# Patient Record
Sex: Female | Born: 1940 | Race: White | Hispanic: No | Smoking: Former smoker
Health system: Southern US, Community
[De-identification: ages and names within clinical notes are randomized; demographics above are authoritative.]

## PROBLEM LIST (undated history)

## (undated) DIAGNOSIS — K5792 Diverticulitis of intestine, part unspecified, without perforation or abscess without bleeding: Secondary | ICD-10-CM

## (undated) DIAGNOSIS — I10 Essential (primary) hypertension: Secondary | ICD-10-CM

## (undated) DIAGNOSIS — E119 Type 2 diabetes mellitus without complications: Secondary | ICD-10-CM

## (undated) DIAGNOSIS — R011 Cardiac murmur, unspecified: Secondary | ICD-10-CM

## (undated) DIAGNOSIS — K5900 Constipation, unspecified: Secondary | ICD-10-CM

## (undated) DIAGNOSIS — R079 Chest pain, unspecified: Secondary | ICD-10-CM

---

## 2022-04-04 ENCOUNTER — Other Ambulatory Visit: Payer: Self-pay

## 2022-04-04 ENCOUNTER — Encounter (HOSPITAL_BASED_OUTPATIENT_CLINIC_OR_DEPARTMENT_OTHER): Payer: Self-pay

## 2022-04-04 ENCOUNTER — Emergency Department (HOSPITAL_BASED_OUTPATIENT_CLINIC_OR_DEPARTMENT_OTHER)
Admission: EM | Admit: 2022-04-04 | Discharge: 2022-04-04 | Disposition: A | Discharge status: Home / Self Care | Payer: Medicare Other | Attending: Emergency Medicine | Admitting: Emergency Medicine

## 2022-04-04 DIAGNOSIS — R42 Dizziness and giddiness: Secondary | ICD-10-CM | POA: Diagnosis present

## 2022-04-04 HISTORY — DX: Diverticulitis of intestine, part unspecified, without perforation or abscess without bleeding: K57.92

## 2022-04-04 HISTORY — DX: Essential (primary) hypertension: I10

## 2022-04-04 HISTORY — DX: Constipation, unspecified: K59.00

## 2022-04-04 HISTORY — DX: Cardiac murmur, unspecified: R01.1

## 2022-04-04 HISTORY — DX: Chest pain, unspecified: R07.9

## 2022-04-04 HISTORY — DX: Type 2 diabetes mellitus without complications: E11.9

## 2022-04-04 LAB — CBC WITH DIFFERENTIAL/PLATELET
Abs Immature Granulocytes: 0.03 10*3/uL (ref 0.00–0.07)
Basophils Absolute: 0 10*3/uL (ref 0.0–0.1)
Basophils Relative: 1 %
Eosinophils Absolute: 0.1 10*3/uL (ref 0.0–0.5)
Eosinophils Relative: 1 %
HCT: 35.6 % — ABNORMAL LOW (ref 36.0–46.0)
Hemoglobin: 11.7 g/dL — ABNORMAL LOW (ref 12.0–15.0)
Immature Granulocytes: 1 %
Lymphocytes Relative: 11 %
Lymphs Abs: 0.6 10*3/uL — ABNORMAL LOW (ref 0.7–4.0)
MCH: 31.9 pg (ref 26.0–34.0)
MCHC: 32.9 g/dL (ref 30.0–36.0)
MCV: 97 fL (ref 80.0–100.0)
Monocytes Absolute: 0.5 10*3/uL (ref 0.1–1.0)
Monocytes Relative: 9 %
Neutro Abs: 4.4 10*3/uL (ref 1.7–7.7)
Neutrophils Relative %: 77 %
Platelets: 216 10*3/uL (ref 150–400)
RBC: 3.67 MIL/uL — ABNORMAL LOW (ref 3.87–5.11)
RDW: 12.8 % (ref 11.5–15.5)
WBC: 5.7 10*3/uL (ref 4.0–10.5)
nRBC: 0 % (ref 0.0–0.2)

## 2022-04-04 LAB — COMPREHENSIVE METABOLIC PANEL
ALT: 23 U/L (ref 0–44)
AST: 24 U/L (ref 15–41)
Albumin: 3.6 g/dL (ref 3.5–5.0)
Alkaline Phosphatase: 60 U/L (ref 38–126)
Anion gap: 6 (ref 5–15)
BUN: 39 mg/dL — ABNORMAL HIGH (ref 8–23)
CO2: 26 mmol/L (ref 22–32)
Calcium: 10.1 mg/dL (ref 8.9–10.3)
Chloride: 108 mmol/L (ref 98–111)
Creatinine, Ser: 1.06 mg/dL — ABNORMAL HIGH (ref 0.44–1.00)
GFR, Estimated: 52 mL/min — ABNORMAL LOW (ref >60)
Glucose, Bld: 152 mg/dL — ABNORMAL HIGH (ref 70–99)
Potassium: 3.7 mmol/L (ref 3.5–5.1)
Sodium: 140 mmol/L (ref 135–145)
Total Bilirubin: <0.1 mg/dL — ABNORMAL LOW (ref 0.3–1.2)
Total Protein: 6.1 g/dL — ABNORMAL LOW (ref 6.5–8.1)

## 2022-04-04 LAB — TROPONIN I (HIGH SENSITIVITY): Troponin I (High Sensitivity): 13 ng/L (ref <18)

## 2022-04-04 MED ORDER — MECLIZINE HCL 25 MG PO TABS
25.0000 mg | ORAL_TABLET | Freq: Once | ORAL | Status: AC
  Administered 2022-04-04: 25 mg via ORAL
  Filled 2022-04-04: qty 1

## 2022-04-04 MED ORDER — SODIUM CHLORIDE 0.9 % IV BOLUS
1000.0000 mL | Freq: Once | INTRAVENOUS | Status: AC
  Administered 2022-04-04: 1000 mL via INTRAVENOUS

## 2022-04-04 NOTE — Discharge Instructions (Signed)
You appear dehydrated on your blood work.  Please try and increase your fluid intake.  Follow-up with your family doctor in the office. ?

## 2022-04-04 NOTE — ED Notes (Signed)
Patient discharged to home.  All discharge instructions reviewed.  Patient verbalized understanding via teachback method.  VS WDL.  Respirations even and unlabored.  Ambulatory out of ED.   °

## 2022-04-04 NOTE — ED Triage Notes (Signed)
Pt reports dizziness since her last hospital visit 03/19/22. Feels weak , difficulty ambulating and cant sleep at night. Bowels are not moving good. Last BM yesterday but small ?

## 2022-04-04 NOTE — ED Provider Notes (Signed)
?[facility] EMERGENCY DEPARTMENT ?Provider Note ? ? ?CSN: [id number] ?Arrival date & time: 04/04/22  1842 ? ?  ? ?History ? ?Chief Complaint  ?Patient presents with  ? Dizziness  ? ? ?April Cuevas is a 81 y.o. female. ? ?81 yo F with a chief complaint of dizziness.  This has been an ongoing issue for her.  She tells me that its been getting progressively worse over the past 3 weeks or so.  Sometimes better and sometimes it is worse.  This morning she was feeling fine and she was up and moving around and went out shopping.  She later this afternoon went to get up and felt like her legs were too weak to hold her and that she might fall down.  She denies any actual injury.  She is also been complaining that her ear has been bothering her off and on for a while.  Seems to come and go.  Been bothering her for the last 3 days.  No cough congestion or fevers.  She was on an antibiotic when she was in the hospital for diverticulitis.  Otherwise denies any new medications. ? ? ?Dizziness ? ?  ? ?Home Medications ?Prior to Admission medications   ?Not on File  ?   ? ?Allergies    ?Penicillins   ? ?Review of Systems   ?Review of Systems  ?Neurological:  Positive for dizziness.  ? ?Physical Exam ?Updated Vital Signs ?BP 135/62 (BP Location: Right Arm)   Pulse 77   Temp 98.4 ?F (36.9 ?C)   Resp 17   Ht 5\' 3"  (1.6 m)   Wt 65.8 kg   SpO2 94%   BMI 25.69 kg/m?  ?Physical Exam ? ?ED Results / Procedures / Treatments   ?Labs ?(all labs ordered are listed, but only abnormal results are displayed) ?Labs Reviewed  ?CBC WITH DIFFERENTIAL/PLATELET - Abnormal; Notable for the following components:  ?    Result Value  ? RBC 3.67 (*)   ? Hemoglobin 11.7 (*)   ? HCT 35.6 (*)   ? Lymphs Abs 0.6 (*)   ? All other components within normal limits  ?COMPREHENSIVE METABOLIC PANEL - Abnormal; Notable for the following components:  ? Glucose, Bld 152 (*)   ? BUN 39 (*)   ? Creatinine, Ser 1.06 (*)   ? Total Protein 6.1 (*)   ? Total  Bilirubin <0.1 (*)   ? GFR, Estimated 52 (*)   ? All other components within normal limits  ?TROPONIN I (HIGH SENSITIVITY)  ? ? ?EKG ?EKG Interpretation ? ?Date/Time:  Monday April 04 2022 19:10:39 EDT ?Ventricular Rate:  70 ?PR Interval:  252 ?QRS Duration: 97 ?QT Interval:  400 ?QTC Calculation: 432 ?R Axis:   7 ?Text Interpretation: Sinus rhythm Prolonged PR interval LVH with secondary repolarization abnormality No old tracing to compare Confirmed by 12-11-1989 [phone]) on 04/04/2022 8:07:31 PM ? ?Radiology ?No results found. ? ?Procedures ?Procedures  ? ? ?Medications Ordered in ED ?Medications  ?sodium chloride 0.9 % bolus 1,000 mL (0 mLs Intravenous Stopped 04/04/22 2050)  ?meclizine (ANTIVERT) tablet 25 mg (25 mg Oral Given 04/04/22 1935)  ? ? ?ED Course/ Medical Decision Making/ A&P ?  ?                        ?Medical Decision Making ?Amount and/or Complexity of Data Reviewed ?Labs: ordered. ? ? ?81 yo F with a chief complaints of fatigue.  This seems  to happen off and on and seems to occur not infrequently and not consistently.  The patient without any obvious stroke symptoms.  Complaining mostly of fatigue when she stands up.  We will give a bolus of IV fluids.  Check blood work.  Reassess. ? ?Patient's blood work is resulted and showed her BUN is up a bit which I suspect is likely due to acute dehydration.  Patient is feeling a bit better after IV fluids and like to go home.  PCP follow-up. ? ?9:17 PM:  I have discussed the diagnosis/risks/treatment options with the patient and family.  Evaluation and diagnostic testing in the emergency department does not suggest an emergent condition requiring admission or immediate intervention beyond what has been performed at this time.  They will follow up with  PCP. We also discussed returning to the ED immediately if new or worsening sx occur. We discussed the sx which are most concerning (e.g., sudden worsening pain, fever, inability to tolerate by mouth) that  necessitate immediate return. Medications administered to the patient during their visit and any new prescriptions provided to the patient are listed below. ? ?Medications given during this visit ?Medications  ?sodium chloride 0.9 % bolus 1,000 mL (0 mLs Intravenous Stopped 04/04/22 2050)  ?meclizine (ANTIVERT) tablet 25 mg (25 mg Oral Given 04/04/22 1935)  ? ? ? ?The patient appears reasonably screen and/or stabilized for discharge and I doubt any other medical condition or other [facility] requiring further screening, evaluation, or treatment in the ED at this time prior to discharge.  ? ? ? ? ? ? ? ? ?Final Clinical Impression(s) / ED Diagnoses ?Final diagnoses:  ?Dizziness  ? ? ?Rx / DC Orders ?ED Discharge Orders   ? ? None  ? ?  ? ? ?  ?Melene Plan, DO ?04/04/22 2117 ? ?

## 2022-04-29 ENCOUNTER — Encounter (HOSPITAL_BASED_OUTPATIENT_CLINIC_OR_DEPARTMENT_OTHER): Payer: Self-pay | Admitting: Emergency Medicine

## 2022-04-29 ENCOUNTER — Emergency Department (HOSPITAL_BASED_OUTPATIENT_CLINIC_OR_DEPARTMENT_OTHER): Payer: Medicare Other

## 2022-04-29 ENCOUNTER — Emergency Department (HOSPITAL_BASED_OUTPATIENT_CLINIC_OR_DEPARTMENT_OTHER)
Admission: EM | Admit: 2022-04-29 | Discharge: 2022-04-29 | Disposition: A | Discharge status: Home / Self Care | Payer: Medicare Other | Attending: Emergency Medicine | Admitting: Emergency Medicine

## 2022-04-29 ENCOUNTER — Other Ambulatory Visit: Payer: Self-pay

## 2022-04-29 DIAGNOSIS — M25512 Pain in left shoulder: Secondary | ICD-10-CM | POA: Insufficient documentation

## 2022-04-29 DIAGNOSIS — M25511 Pain in right shoulder: Secondary | ICD-10-CM | POA: Insufficient documentation

## 2022-04-29 DIAGNOSIS — M542 Cervicalgia: Secondary | ICD-10-CM | POA: Insufficient documentation

## 2022-04-29 DIAGNOSIS — R079 Chest pain, unspecified: Secondary | ICD-10-CM | POA: Diagnosis present

## 2022-04-29 LAB — CBC
HCT: 37.8 % (ref 36.0–46.0)
Hemoglobin: 12.8 g/dL (ref 12.0–15.0)
MCH: 32.2 pg (ref 26.0–34.0)
MCHC: 33.9 g/dL (ref 30.0–36.0)
MCV: 95.2 fL (ref 80.0–100.0)
Platelets: 204 10*3/uL (ref 150–400)
RBC: 3.97 MIL/uL (ref 3.87–5.11)
RDW: 12.1 % (ref 11.5–15.5)
WBC: 6.8 10*3/uL (ref 4.0–10.5)
nRBC: 0 % (ref 0.0–0.2)

## 2022-04-29 LAB — BASIC METABOLIC PANEL
Anion gap: 4 — ABNORMAL LOW (ref 5–15)
BUN: 44 mg/dL — ABNORMAL HIGH (ref 8–23)
CO2: 25 mmol/L (ref 22–32)
Calcium: 10.7 mg/dL — ABNORMAL HIGH (ref 8.9–10.3)
Chloride: 107 mmol/L (ref 98–111)
Creatinine, Ser: 1.02 mg/dL — ABNORMAL HIGH (ref 0.44–1.00)
GFR, Estimated: 55 mL/min — ABNORMAL LOW (ref >60)
Glucose, Bld: 192 mg/dL — ABNORMAL HIGH (ref 70–99)
Potassium: 4 mmol/L (ref 3.5–5.1)
Sodium: 136 mmol/L (ref 135–145)

## 2022-04-29 LAB — TROPONIN I (HIGH SENSITIVITY)
Troponin I (High Sensitivity): 13 ng/L (ref <18)
Troponin I (High Sensitivity): 13 ng/L (ref <18)

## 2022-04-29 MED ORDER — ALUM & MAG HYDROXIDE-SIMETH 200-200-20 MG/5ML PO SUSP
30.0000 mL | Freq: Once | ORAL | Status: AC
  Administered 2022-04-29: 30 mL via ORAL
  Filled 2022-04-29: qty 30

## 2022-04-29 NOTE — ED Provider Notes (Signed)
[facility] EMERGENCY DEPARTMENT Provider Note   CSN: [id number] Arrival date & time: 04/29/22  1332     History  Chief Complaint  Patient presents with   Chest Pain    April Cuevas is a 81 y.o. female.  81 yo F with a chief complaints of chest pain.  This actually is pain to the left lateral side of her neck and occurred while she was eating.  She was told by other people there that she turned a little bit white.  She denies choking on any food.  Denies having any difficulty breathing with it.  Lasted for a few moments and then ended.  She is worried that this might of been another heart attack.  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  She denies cough congestion or fever.   Chest Pain     Home Medications Prior to Admission medications   Not on File      Allergies    Penicillins    Review of Systems   Review of Systems  Cardiovascular:  Positive for chest pain.   Physical Exam Updated Vital Signs BP 130/71 (BP Location: Right Arm)   Pulse 69   Temp 97.8 F (36.6 C) (Oral)   Resp 18   SpO2 100%  Physical Exam Vitals and nursing note reviewed.  Constitutional:      General: She is not in acute distress.    Appearance: She is well-developed. She is not diaphoretic.  HENT:     Head: Normocephalic and atraumatic.  Eyes:     Pupils: Pupils are equal, round, and reactive to light.  Cardiovascular:     Rate and Rhythm: Normal rate and regular rhythm.     Heart sounds: No murmur heard.   No friction rub. No gallop.  Pulmonary:     Effort: Pulmonary effort is normal.     Breath sounds: No wheezing or rales.  Abdominal:     General: There is no distension.     Palpations: Abdomen is soft.     Tenderness: There is no abdominal tenderness.  Musculoskeletal:        General: No tenderness.     Cervical back: Normal range of motion and neck supple.  Skin:    General: Skin is warm and dry.  Neurological:     Mental Status: She is  alert and oriented to person, place, and time.  Psychiatric:        Behavior: Behavior normal.    ED Results / Procedures / Treatments   Labs (all labs ordered are listed, but only abnormal results are displayed) Labs Reviewed  BASIC METABOLIC PANEL  CBC  TROPONIN I (HIGH SENSITIVITY)    EKG EKG Interpretation  Date/Time:  Friday Apr 29 2022 13:39:53 EDT Ventricular Rate:  68 PR Interval:  252 QRS Duration: 84 QT Interval:  370 QTC Calculation: 393 R Axis:   5 Text Interpretation: Sinus rhythm with 1st degree A-V block Moderate voltage criteria for LVH, may be normal variant ( R in aVL , Cornell product ) Septal infarct , age undetermined Abnormal ECG No significant change since last tracing Confirmed by Melene Plan [phone]) on 04/29/2022 1:53:46 PM  Radiology No results found.  Procedures Procedures    Medications Ordered in ED Medications - No data to display  ED Course/ Medical Decision Making/ A&P  Medical Decision Making Risk OTC drugs.   81 yo F with a chief complaint of concern that she was having a heart attack.  The patient describes pain to the left side of her neck and after which had some pain to her shoulders.  The pain to the neck lasted very briefly and occurred while she was eating.  She tells me that she has had multiple heart attacks in the past though on record review she has been seen by cardiology through a different hospital system that documented no obvious history of MI and no obvious history of stent placement.    Also on record review appears the patient has had multiple visits for similar.  Seen mostly through the [facility] and has been seen by cardiology.  We will obtain a delta troponin.  Chest x-ray blood work oral trial reassess.   First trop negative, no significant electrolyte abnormality, no anemia.  CXR independently interpreted by without focal infiltrate or ptx.  Signed out to Dr.  Silverio Lay, awaiting delta.   The patients results and plan were reviewed and discussed.   Any x-rays performed were independently reviewed by myself.   Differential diagnosis were considered with the presenting HPI.  Medications  alum & mag hydroxide-simeth (MAALOX/MYLANTA) 200-200-20 MG/5ML suspension 30 mL (30 mLs Oral Given 04/29/22 1422)    Vitals:   04/29/22 1445 04/29/22 1515 04/29/22 1615 04/29/22 1630  BP: 103/65 106/66 124/63 125/65  Pulse: 66 64 64 (!) 59  Resp: 16 19 (!) 25 16  Temp:      TempSrc:      SpO2: 93% 92% 98% 93%    Final diagnoses:  Chest pain, unspecified type          Final Clinical Impression(s) / ED Diagnoses Final diagnoses:  None    Rx / DC Orders ED Discharge Orders     None         Melene Plan, DO 05/02/22 0700

## 2022-04-29 NOTE — ED Triage Notes (Signed)
Had cp since last night it went away and then it came back  , her left shoulder hurts , no sob n.v,  took a nitro  right before coming

## 2022-04-29 NOTE — ED Provider Notes (Signed)
  Physical Exam  BP 124/63   Pulse 64   Temp 97.8 F (36.6 C) (Oral)   Resp (!) 25   SpO2 98%   Physical Exam  Procedures  Procedures  ED Course / MDM    Medical Decision Making Care assumed at 3 PM.  Patient is here with chest pain.  Patient states that she has history of CAD but has seen cardiology previously had a clean cath.  Low suspicion for ACS.  Signout pending second troponin  4:42 PM I reviewed patient's labs.  Labs and chest x-ray unremarkable.  Second troponin negative.  Stable for discharge and encouraged her to follow-up with her cardiologist  Problems Addressed: Chest pain, unspecified type: acute illness or injury  Amount and/or Complexity of Data Reviewed Independent Historian: parent Labs: ordered. Decision-making details documented in ED Course. Radiology: ordered and independent interpretation performed. Decision-making details documented in ED Course. ECG/medicine tests: ordered and independent interpretation performed. Decision-making details documented in ED Course.  Risk OTC drugs.          Drenda Freeze, MD 04/29/22 [phone]

## 2022-04-29 NOTE — ED Notes (Signed)
Pt discharged to home. Discharge instructions have been discussed with patient and/or family members. Pt verbally acknowledges understanding d/c instructions, and endorses comprehension to checkout at registration before leaving.  °

## 2022-04-29 NOTE — Discharge Instructions (Signed)
Your heart enzymes are normal today.  Please follow-up with your cardiologist at [facility]  Return to ER if you have worse chest pain, shortness of breath.

## 2022-10-17 ENCOUNTER — Other Ambulatory Visit: Payer: Self-pay

## 2022-10-17 ENCOUNTER — Encounter (HOSPITAL_BASED_OUTPATIENT_CLINIC_OR_DEPARTMENT_OTHER): Payer: Self-pay | Admitting: Emergency Medicine

## 2022-10-17 ENCOUNTER — Emergency Department (HOSPITAL_BASED_OUTPATIENT_CLINIC_OR_DEPARTMENT_OTHER)
Admission: EM | Admit: 2022-10-17 | Discharge: 2022-10-17 | Discharge status: Against Medical Advice | Payer: Medicare Other | Attending: Emergency Medicine | Admitting: Emergency Medicine

## 2022-10-17 ENCOUNTER — Emergency Department (HOSPITAL_BASED_OUTPATIENT_CLINIC_OR_DEPARTMENT_OTHER): Payer: Medicare Other

## 2022-10-17 DIAGNOSIS — M79605 Pain in left leg: Secondary | ICD-10-CM | POA: Diagnosis not present

## 2022-10-17 DIAGNOSIS — R110 Nausea: Secondary | ICD-10-CM | POA: Insufficient documentation

## 2022-10-17 DIAGNOSIS — Z5321 Procedure and treatment not carried out due to patient leaving prior to being seen by health care provider: Secondary | ICD-10-CM | POA: Diagnosis not present

## 2022-10-17 LAB — CBC WITH DIFFERENTIAL/PLATELET
Abs Immature Granulocytes: 0.01 10*3/uL (ref 0.00–0.07)
Basophils Absolute: 0 10*3/uL (ref 0.0–0.1)
Basophils Relative: 1 %
Eosinophils Absolute: 0.1 10*3/uL (ref 0.0–0.5)
Eosinophils Relative: 2 %
HCT: 38.6 % (ref 36.0–46.0)
Hemoglobin: 12.9 g/dL (ref 12.0–15.0)
Immature Granulocytes: 0 %
Lymphocytes Relative: 19 %
Lymphs Abs: 1.1 10*3/uL (ref 0.7–4.0)
MCH: 31.4 pg (ref 26.0–34.0)
MCHC: 33.4 g/dL (ref 30.0–36.0)
MCV: 93.9 fL (ref 80.0–100.0)
Monocytes Absolute: 0.7 10*3/uL (ref 0.1–1.0)
Monocytes Relative: 12 %
Neutro Abs: 3.6 10*3/uL (ref 1.7–7.7)
Neutrophils Relative %: 66 %
Platelets: 213 10*3/uL (ref 150–400)
RBC: 4.11 MIL/uL (ref 3.87–5.11)
RDW: 11.8 % (ref 11.5–15.5)
WBC: 5.5 10*3/uL (ref 4.0–10.5)
nRBC: 0 % (ref 0.0–0.2)

## 2022-10-17 LAB — COMPREHENSIVE METABOLIC PANEL
ALT: 20 U/L (ref 0–44)
AST: 20 U/L (ref 15–41)
Albumin: 3.7 g/dL (ref 3.5–5.0)
Alkaline Phosphatase: 94 U/L (ref 38–126)
Anion gap: 9 (ref 5–15)
BUN: 39 mg/dL — ABNORMAL HIGH (ref 8–23)
CO2: 25 mmol/L (ref 22–32)
Calcium: 10.6 mg/dL — ABNORMAL HIGH (ref 8.9–10.3)
Chloride: 102 mmol/L (ref 98–111)
Creatinine, Ser: 1.28 mg/dL — ABNORMAL HIGH (ref 0.44–1.00)
GFR, Estimated: 42 mL/min — ABNORMAL LOW (ref >60)
Glucose, Bld: 170 mg/dL — ABNORMAL HIGH (ref 70–99)
Potassium: 4 mmol/L (ref 3.5–5.1)
Sodium: 136 mmol/L (ref 135–145)
Total Bilirubin: 0.4 mg/dL (ref 0.3–1.2)
Total Protein: 6.7 g/dL (ref 6.5–8.1)

## 2022-10-17 LAB — PROTIME-INR
INR: 0.9 (ref 0.8–1.2)
Prothrombin Time: 12.2 seconds (ref 11.4–15.2)

## 2022-10-17 NOTE — ED Triage Notes (Signed)
Pt seen at [facility] on 11/2. Dx with DVT in left leg. Told to follow up outpatient. Pt returns today due to new onset nausea and continued left leg pain.

## 2023-02-02 ENCOUNTER — Emergency Department (HOSPITAL_COMMUNITY)
Admission: EM | Admit: 2023-02-02 | Discharge: 2023-02-03 | Disposition: A | Discharge status: Home / Self Care | Payer: Medicare Other | Attending: Emergency Medicine | Admitting: Emergency Medicine

## 2023-02-02 ENCOUNTER — Emergency Department (HOSPITAL_COMMUNITY): Payer: Medicare Other

## 2023-02-02 DIAGNOSIS — Z1152 Encounter for screening for COVID-19: Secondary | ICD-10-CM | POA: Insufficient documentation

## 2023-02-02 DIAGNOSIS — R531 Weakness: Secondary | ICD-10-CM | POA: Insufficient documentation

## 2023-02-02 DIAGNOSIS — I2510 Atherosclerotic heart disease of native coronary artery without angina pectoris: Secondary | ICD-10-CM | POA: Insufficient documentation

## 2023-02-02 DIAGNOSIS — J168 Pneumonia due to other specified infectious organisms: Secondary | ICD-10-CM | POA: Insufficient documentation

## 2023-02-02 DIAGNOSIS — R55 Syncope and collapse: Secondary | ICD-10-CM | POA: Diagnosis not present

## 2023-02-02 DIAGNOSIS — I10 Essential (primary) hypertension: Secondary | ICD-10-CM | POA: Diagnosis not present

## 2023-02-02 DIAGNOSIS — J189 Pneumonia, unspecified organism: Secondary | ICD-10-CM

## 2023-02-02 DIAGNOSIS — R059 Cough, unspecified: Secondary | ICD-10-CM | POA: Diagnosis present

## 2023-02-02 LAB — CBC WITH DIFFERENTIAL/PLATELET
Abs Immature Granulocytes: 0.01 10*3/uL (ref 0.00–0.07)
Basophils Absolute: 0 10*3/uL (ref 0.0–0.1)
Basophils Relative: 1 %
Eosinophils Absolute: 0.1 10*3/uL (ref 0.0–0.5)
Eosinophils Relative: 2 %
HCT: 38.1 % (ref 36.0–46.0)
Hemoglobin: 12.6 g/dL (ref 12.0–15.0)
Immature Granulocytes: 0 %
Lymphocytes Relative: 17 %
Lymphs Abs: 1 10*3/uL (ref 0.7–4.0)
MCH: 31.7 pg (ref 26.0–34.0)
MCHC: 33.1 g/dL (ref 30.0–36.0)
MCV: 96 fL (ref 80.0–100.0)
Monocytes Absolute: 0.5 10*3/uL (ref 0.1–1.0)
Monocytes Relative: 9 %
Neutro Abs: 4.1 10*3/uL (ref 1.7–7.7)
Neutrophils Relative %: 71 %
Platelets: 170 10*3/uL (ref 150–400)
RBC: 3.97 MIL/uL (ref 3.87–5.11)
RDW: 11.8 % (ref 11.5–15.5)
WBC: 5.8 10*3/uL (ref 4.0–10.5)
nRBC: 0 % (ref 0.0–0.2)

## 2023-02-02 LAB — COMPREHENSIVE METABOLIC PANEL
ALT: 14 U/L (ref 0–44)
AST: 16 U/L (ref 15–41)
Albumin: 3.1 g/dL — ABNORMAL LOW (ref 3.5–5.0)
Alkaline Phosphatase: 76 U/L (ref 38–126)
Anion gap: 7 (ref 5–15)
BUN: 35 mg/dL — ABNORMAL HIGH (ref 8–23)
CO2: 24 mmol/L (ref 22–32)
Calcium: 9.7 mg/dL (ref 8.9–10.3)
Chloride: 108 mmol/L (ref 98–111)
Creatinine, Ser: 1.08 mg/dL — ABNORMAL HIGH (ref 0.44–1.00)
GFR, Estimated: 51 mL/min — ABNORMAL LOW (ref >60)
Glucose, Bld: 189 mg/dL — ABNORMAL HIGH (ref 70–99)
Potassium: 3.6 mmol/L (ref 3.5–5.1)
Sodium: 139 mmol/L (ref 135–145)
Total Bilirubin: 0.5 mg/dL (ref 0.3–1.2)
Total Protein: 5.5 g/dL — ABNORMAL LOW (ref 6.5–8.1)

## 2023-02-02 LAB — TROPONIN I (HIGH SENSITIVITY)
Troponin I (High Sensitivity): 15 ng/L (ref <18)
Troponin I (High Sensitivity): 18 ng/L — ABNORMAL HIGH (ref <18)

## 2023-02-02 LAB — RESP PANEL BY RT-PCR (RSV, FLU A&B, COVID)  RVPGX2
Influenza A by PCR: NEGATIVE
Influenza B by PCR: NEGATIVE
Resp Syncytial Virus by PCR: NEGATIVE
SARS Coronavirus 2 by RT PCR: NEGATIVE

## 2023-02-02 MED ORDER — DOXYCYCLINE HYCLATE 100 MG PO TABS
100.0000 mg | ORAL_TABLET | Freq: Once | ORAL | Status: AC
  Administered 2023-02-03: 100 mg via ORAL

## 2023-02-02 MED ORDER — ASPIRIN 81 MG PO CHEW
324.0000 mg | CHEWABLE_TABLET | Freq: Once | ORAL | Status: AC
  Administered 2023-02-02: 324 mg via ORAL
  Filled 2023-02-02: qty 4

## 2023-02-02 MED ORDER — DOXYCYCLINE HYCLATE 100 MG PO CAPS: 100.0000 mg | ORAL_CAPSULE | Freq: Two times a day (BID) | ORAL | 0 refills | Status: AC

## 2023-02-02 MED ORDER — LACTATED RINGERS IV BOLUS
1000.0000 mL | Freq: Once | INTRAVENOUS | Status: AC
  Administered 2023-02-02: 1000 mL via INTRAVENOUS

## 2023-02-02 NOTE — ED Provider Notes (Signed)
[facility] Provider Note   CSN: [id number] Arrival date & time: 02/02/23  1946     History  Chief Complaint  Patient presents with   Near Syncope    April Cuevas is a 82 y.o. female.  HPI 82 year old female with a history of hypertension, CAD, hypertrophic cardiomyopathy, PVCs presents with not feeling well.  She is a vague historian but it seems like she had a cough with some productive sputum for couple days.  She has chronic dyspnea but this is unchanged from baseline.  Today she has been feeling some upper chest discomfort, she is not sure what time it started.  She feels generally weak and was having a harder time walking the normal though did not fall.  Did not feel off balance, just like her legs would not support her.  No focal weakness, headache, neck pain, abdominal pain or vomiting.  Right now her chest is not hurting.  Home Medications Prior to Admission medications   Medication Sig Start Date End Date Taking? Authorizing Provider  doxycycline (VIBRAMYCIN) 100 MG capsule Take 1 capsule (100 mg total) by mouth 2 (two) times daily. One po bid x 7 days 02/02/23  Yes Sherwood Gambler, MD      Allergies    Penicillins    Review of Systems   Review of Systems  Constitutional:  Negative for fever.  Respiratory:  Positive for cough and shortness of breath (chronic, unchanged).   Cardiovascular:  Positive for chest pain. Negative for leg swelling.  Gastrointestinal:  Positive for nausea. Negative for abdominal pain and vomiting.  Neurological:  Positive for weakness and light-headedness. Negative for dizziness and headaches.    Physical Exam Updated Vital Signs BP (!) 183/74   Pulse 66   Temp 97.9 F (36.6 C) (Oral)   Resp 16   SpO2 96%  Physical Exam Vitals and nursing note reviewed.  Constitutional:      General: She is not in acute distress.    Appearance: She is well-developed. She is not ill-appearing or diaphoretic.   HENT:     Head: Normocephalic and atraumatic.  Eyes:     Extraocular Movements: Extraocular movements intact.     Pupils: Pupils are equal, round, and reactive to light.  Cardiovascular:     Rate and Rhythm: Normal rate and regular rhythm.     Heart sounds: Normal heart sounds.  Pulmonary:     Effort: Pulmonary effort is normal.     Breath sounds: Normal breath sounds.  Abdominal:     General: There is no distension.     Palpations: Abdomen is soft.     Tenderness: There is no abdominal tenderness.  Skin:    General: Skin is warm and dry.  Neurological:     Mental Status: She is alert and oriented to person, place, and time.     Comments: CN 3-12 grossly intact. 5/5 strength in all 4 extremities. Grossly normal sensation. Normal finger to nose.      ED Results / Procedures / Treatments   Labs (all labs ordered are listed, but only abnormal results are displayed) Labs Reviewed  COMPREHENSIVE METABOLIC PANEL - Abnormal; Notable for the following components:      Result Value   Glucose, Bld 189 (*)    BUN 35 (*)    Creatinine, Ser 1.08 (*)    Total Protein 5.5 (*)    Albumin 3.1 (*)    GFR, Estimated 51 (*)  All other components within normal limits  TROPONIN I (HIGH SENSITIVITY) - Abnormal; Notable for the following components:   Troponin I (High Sensitivity) 18 (*)    All other components within normal limits  RESP PANEL BY RT-PCR (RSV, FLU A&B, COVID)  RVPGX2  CBC WITH DIFFERENTIAL/PLATELET  TROPONIN I (HIGH SENSITIVITY)    EKG EKG Interpretation  Date/Time:  Thursday February 02 2023 20:02:42 EST Ventricular Rate:  70 PR Interval:  262 QRS Duration: 106 QT Interval:  381 QTC Calculation: 412 R Axis:   5 Text Interpretation: Sinus rhythm Prolonged PR interval Left ventricular hypertrophy Anterior Q waves, possibly due to LVH Abnrm T, consider ischemia, anterolateral lds  similar to May 2023 Confirmed by Sherwood Gambler [phone]) on 02/02/2023 8:05:06  PM  Radiology DG Chest 2 View  Result Date: 02/02/2023 CLINICAL DATA:  Cough, mid chest pain EXAM: CHEST - 2 VIEW COMPARISON:  04/29/2022 FINDINGS: Frontal and lateral views of the chest demonstrates stable enlargement of the cardiac silhouette. There is increased central vascular congestion. Bilateral perihilar interstitial and ground-glass opacities. No effusion or pneumothorax. IMPRESSION: 1. Bilateral interstitial and ground-glass opacities, favor edema over atypical viral pneumonia. Electronically Signed   By: Randa Ngo M.D.   On: 02/02/2023 20:50    Procedures Procedures    Medications Ordered in ED Medications  doxycycline (VIBRA-TABS) tablet 100 mg (has no administration in time range)  lactated ringers bolus 1,000 mL (0 mLs Intravenous Stopped 02/02/23 2114)  aspirin chewable tablet 324 mg (324 mg Oral Given 02/02/23 2046)    ED Course/ Medical Decision Making/ A&P                             Medical Decision Making Amount and/or Complexity of Data Reviewed Labs: ordered.    Details: Normal hemoglobin, normal WBC.  Troponin is 15 and then goes to 18. Radiology: ordered and independent interpretation performed.    Details: No pneumonia, questionable fluid  Risk OTC drugs.   Patient presents with generally feeling weak.  She has had a cough and her x-ray does show some potential pneumonia versus edema.  However clinically she does not have any peripheral edema and her most recent echo according to outside records shows she has a normal EF.  I suspect this is possibly some mild pneumonia either viral or atypical pneumonia.  Will put on some antibiotics.  I do not think there is ACS.  Technically her troponin went up but is still well below limits that would be considered MI.  Highly doubt PE.  Discussed options with the patient including staying in the hospital but she declines.  Will give antibiotics, will give her first dose here and then she is not sure which pharmacy she  wants to use I will give her a paper prescription.  Will discharge home with return precautions.        Final Clinical Impression(s) / ED Diagnoses Final diagnoses:  Generalized weakness  Pneumonia due to infectious organism, unspecified laterality, unspecified part of lung    Rx / DC Orders ED Discharge Orders          Ordered    doxycycline (VIBRAMYCIN) 100 MG capsule  2 times daily        02/02/23 2340              Sherwood Gambler, MD 02/02/23 2351

## 2023-02-02 NOTE — Discharge Instructions (Addendum)
Your chest x-ray shows possible pneumonia.  You are being given antibiotics for this.  Follow-up closely with your primary care physician.  If you develop recurrent, continued, or worsening chest pain, shortness of breath, fever, vomiting, abdominal or back pain, or any other new/concerning symptoms then return to the ER for evaluation.

## 2023-02-02 NOTE — ED Triage Notes (Signed)
Pt was BIB by EMS for "feels like I'm going to pass out" Pt describes symptoms as "not feeling well" This is baseline mental status

## 2023-02-03 DIAGNOSIS — J168 Pneumonia due to other specified infectious organisms: Secondary | ICD-10-CM | POA: Diagnosis not present

## 2023-06-17 ENCOUNTER — Other Ambulatory Visit: Payer: Self-pay

## 2023-06-17 ENCOUNTER — Emergency Department (HOSPITAL_BASED_OUTPATIENT_CLINIC_OR_DEPARTMENT_OTHER)
Admission: EM | Admit: 2023-06-17 | Discharge: 2023-06-17 | Discharge status: Against Medical Advice | Payer: Medicare Other | Attending: Emergency Medicine | Admitting: Emergency Medicine

## 2023-06-17 ENCOUNTER — Encounter (HOSPITAL_BASED_OUTPATIENT_CLINIC_OR_DEPARTMENT_OTHER): Payer: Self-pay

## 2023-06-17 DIAGNOSIS — M7989 Other specified soft tissue disorders: Secondary | ICD-10-CM | POA: Insufficient documentation

## 2023-06-17 DIAGNOSIS — Z5321 Procedure and treatment not carried out due to patient leaving prior to being seen by health care provider: Secondary | ICD-10-CM | POA: Insufficient documentation

## 2023-06-17 NOTE — ED Notes (Signed)
Registration reports patient left. Registration attempted to call patient's personal phone, no answer.

## 2023-06-17 NOTE — ED Triage Notes (Signed)
Patient has swelling to left lower leg with blisters that started Friday. She denied fever.

## 2023-11-16 IMAGING — DX DG CHEST 1V PORT
1 series · 1 of 1 positions shown · non-contrast
Comparison: No pertinent prior exams available for comparison.

CLINICAL DATA: Provided history: Chest pain.

EXAM:
PORTABLE CHEST 1 VIEW

[chest ap]
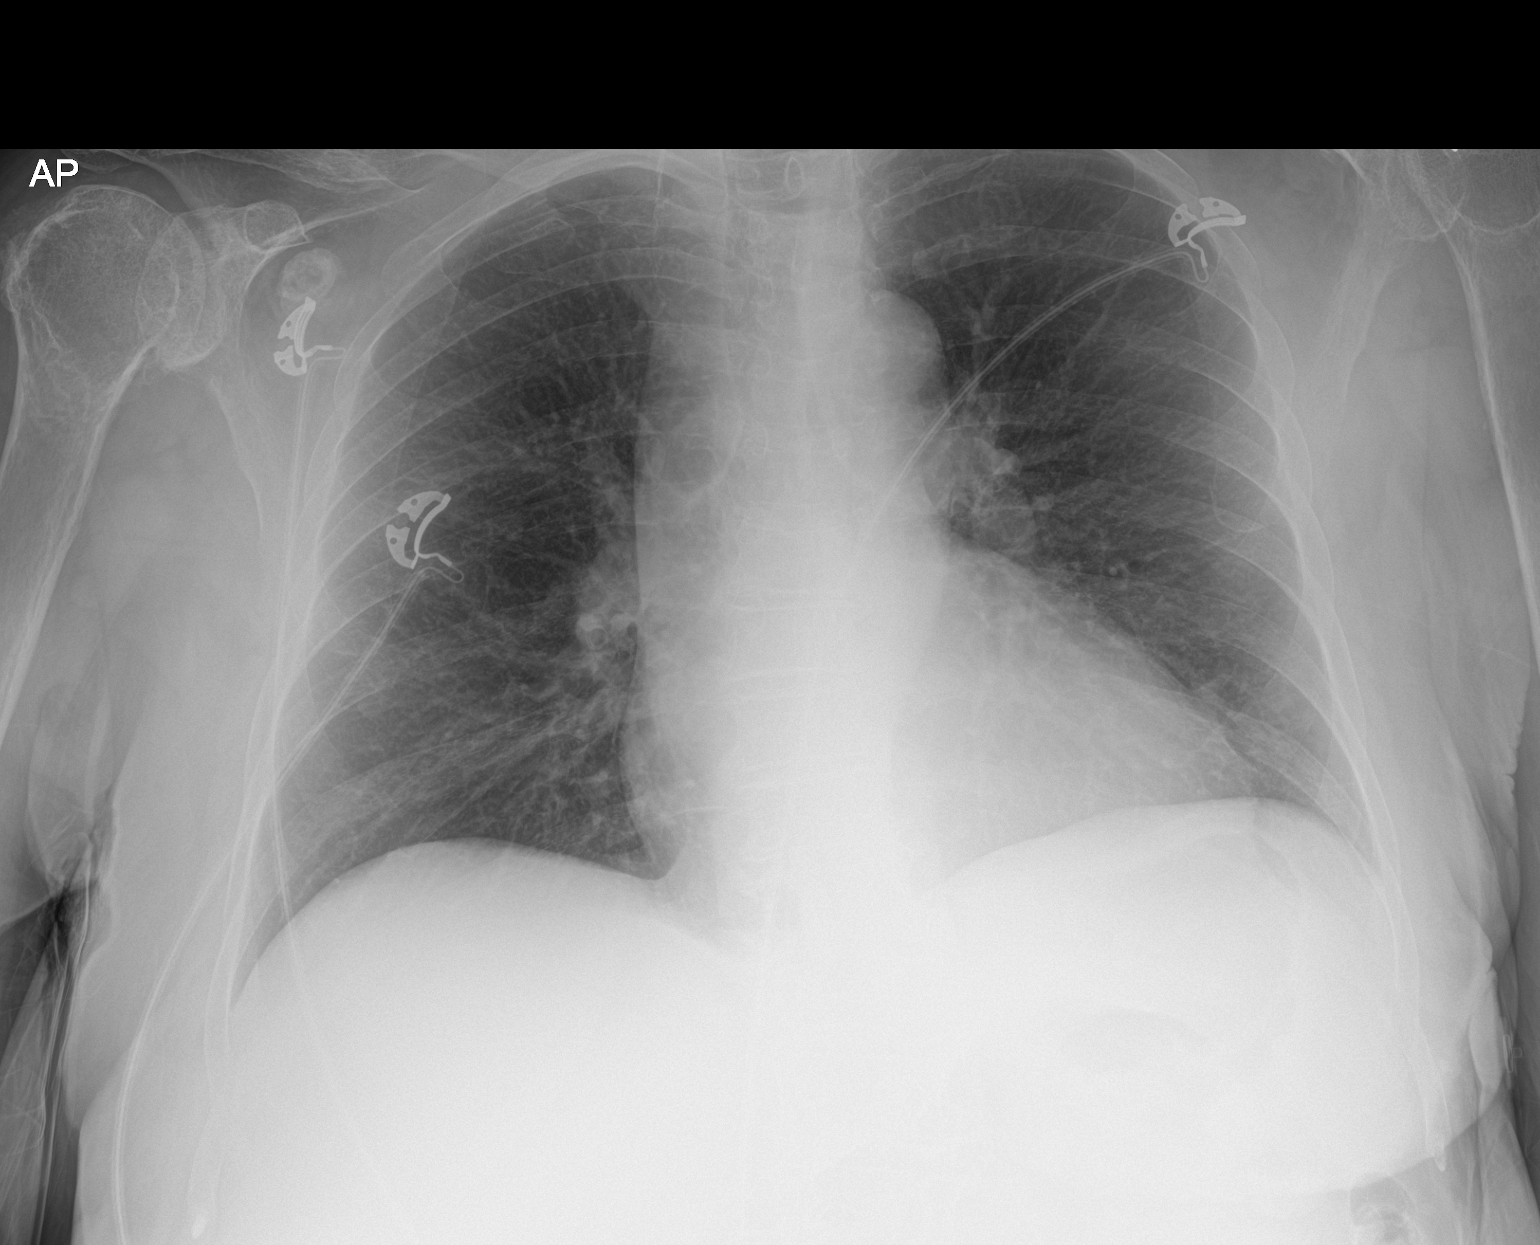

[1 of 1 positions shown; findings below may reference images not displayed]

FINDINGS: Heart size within normal limits. No appreciable airspace
consolidation. No evidence of pleural effusion or pneumothorax. No
acute bony abnormality identified. Ovoid calcific density projecting
over the right scapula, likely reflecting a loose body within the
subscapular recess of the right glenohumeral joint.
IMPRESSION: No evidence of active cardiopulmonary disease.
